# Patient Record
Sex: Female | Born: 1995 | Race: Black or African American | Hispanic: No | Marital: Single | Smoking: Current every day smoker
Health system: Southern US, Community
[De-identification: ages and names within clinical notes are randomized; demographics above are authoritative.]

## PROBLEM LIST (undated history)

## (undated) DIAGNOSIS — E282 Polycystic ovarian syndrome: Secondary | ICD-10-CM

## (undated) HISTORY — PX: KNEE SURGERY: SHX244

---

## 2017-07-19 ENCOUNTER — Encounter (HOSPITAL_COMMUNITY): Payer: Self-pay | Admitting: Emergency Medicine

## 2017-07-19 ENCOUNTER — Emergency Department (HOSPITAL_COMMUNITY)
Admission: EM | Admit: 2017-07-19 | Discharge: 2017-07-19 | Disposition: A | Discharge status: Home / Self Care | Payer: Self-pay | Attending: Emergency Medicine | Admitting: Emergency Medicine

## 2017-07-19 DIAGNOSIS — R110 Nausea: Secondary | ICD-10-CM | POA: Insufficient documentation

## 2017-07-19 DIAGNOSIS — Y929 Unspecified place or not applicable: Secondary | ICD-10-CM | POA: Insufficient documentation

## 2017-07-19 DIAGNOSIS — Y999 Unspecified external cause status: Secondary | ICD-10-CM | POA: Insufficient documentation

## 2017-07-19 DIAGNOSIS — Y9383 Activity, rough housing and horseplay: Secondary | ICD-10-CM | POA: Insufficient documentation

## 2017-07-19 DIAGNOSIS — F17210 Nicotine dependence, cigarettes, uncomplicated: Secondary | ICD-10-CM | POA: Insufficient documentation

## 2017-07-19 DIAGNOSIS — W228XXA Striking against or struck by other objects, initial encounter: Secondary | ICD-10-CM | POA: Insufficient documentation

## 2017-07-19 DIAGNOSIS — S0990XA Unspecified injury of head, initial encounter: Secondary | ICD-10-CM | POA: Insufficient documentation

## 2017-07-19 HISTORY — DX: Polycystic ovarian syndrome: E28.2

## 2017-07-19 NOTE — ED Provider Notes (Signed)
[facility]-EMERGENCY DEPT Provider Note   CSN: [id number] Arrival date & time: 07/19/17  [id number]     History   Chief Complaint Chief Complaint  Patient presents with  . Head Injury    HPI Jasmine Warren is a 22 y.o. female.  HPI   22 year old female presenting after head injury.  Happened last night.  States that Jasmine Warren was horsing around on the floor when Jasmine Warren arched backwards and struck the back of her head.  No loss of consciousness.  Since then Jasmine Warren has had persistent headache, some mild nausea and some difficulty with concentration.  No numbness, tingling or focal loss of strength.  No blood thinners.  Jasmine Warren has been ambulatory without difficulty since that time.  Past Medical History:  Diagnosis Date  . PCOS (polycystic ovarian syndrome)     There are no active problems to display for this patient.   Past Surgical History:  Procedure Laterality Date  . KNEE SURGERY Right     OB History    No data available       Home Medications    Prior to Admission medications   Not on File    Family History No family history on file.  Social History Social History   Tobacco Use  . Smoking status: Current Every Day Smoker    Types: Cigarettes  . Smokeless tobacco: Never Used  Substance Use Topics  . Alcohol use: Not on file  . Drug use: Not on file     Allergies   Patient has no known allergies.   Review of Systems Review of Systems  All systems reviewed and negative, other than as noted in HPI.  Physical Exam Updated Vital Signs BP 109/80 (BP Location: Right Arm)   Pulse 70   Temp 98.1 F (36.7 C) (Oral)   Resp 16   Ht 5\' 10"  (1.778 m)   Wt 68 kg (150 lb)   LMP 04/28/2017   SpO2 97%   BMI 21.52 kg/m   Physical Exam  Constitutional: Jasmine Warren is oriented to person, place, and time. Jasmine Warren appears well-developed and well-nourished. No distress.  HENT:  Head: Normocephalic and atraumatic.  Eyes: Conjunctivae and EOM are  normal. Pupils are equal, round, and reactive to light. Right eye exhibits no discharge. Left eye exhibits no discharge.  Neck: Neck supple.  Cardiovascular: Normal rate, regular rhythm and normal heart sounds. Exam reveals no gallop and no friction rub.  No murmur heard. Pulmonary/Chest: Effort normal and breath sounds normal. No respiratory distress.  Abdominal: Soft. Jasmine Warren exhibits no distension. There is no tenderness.  Musculoskeletal: Jasmine Warren exhibits no edema or tenderness.  No midline spinal tenderness  Neurological: Jasmine Warren is alert and oriented to person, place, and time. No cranial nerve deficit. Jasmine Warren exhibits normal muscle tone. Coordination normal.  Good finger to nose testing bilaterally.  Normal-appearing gait.  Skin: Skin is warm and dry.  Psychiatric: Jasmine Warren has a normal mood and affect. Her behavior is normal. Thought content normal.  Nursing note and vitals reviewed.    ED Treatments / Results  Labs (all labs ordered are listed, but only abnormal results are displayed) Labs Reviewed - No data to display  EKG  EKG Interpretation None       Radiology No results found.  Procedures Procedures (including critical care time)  Medications Ordered in ED Medications - No data to display   Initial Impression / Assessment and Plan / ED Course  I have reviewed the triage vital  signs and the nursing notes.  Pertinent labs & imaging results that were available during my care of the patient were reviewed by me and considered in my medical decision making (see chart for details).     22 year old female with symptoms of mild head injury.  Neuro exam is nonfocal.  No "red flags."  Neuroimaging deferred.  Return precautions were discussed.  As needed NSAIDs and cognitive rest otherwise.  Final Clinical Impressions(s) / ED Diagnoses   Final diagnoses:  Closed head injury, initial encounter    ED Discharge Orders    None       Raeford Razor, MD 07/19/17 [phone]

## 2017-07-19 NOTE — ED Triage Notes (Signed)
Pt reports that she was playing around and hit posterior head on the carpet flooring. Pt reports that she feels pressure in front and lateral sides of head. Denies and LOC or taking any blood thinners. Didn't have any n/v or blurred vision.

## 2017-07-23 ENCOUNTER — Encounter (HOSPITAL_COMMUNITY): Payer: Self-pay | Admitting: Emergency Medicine

## 2017-07-23 ENCOUNTER — Other Ambulatory Visit: Payer: Self-pay

## 2017-07-23 DIAGNOSIS — G4489 Other headache syndrome: Secondary | ICD-10-CM | POA: Insufficient documentation

## 2017-07-23 DIAGNOSIS — R51 Headache: Secondary | ICD-10-CM | POA: Diagnosis present

## 2017-07-23 DIAGNOSIS — F17210 Nicotine dependence, cigarettes, uncomplicated: Secondary | ICD-10-CM | POA: Insufficient documentation

## 2017-07-23 DIAGNOSIS — J029 Acute pharyngitis, unspecified: Secondary | ICD-10-CM | POA: Insufficient documentation

## 2017-07-23 NOTE — ED Triage Notes (Signed)
Pt states she was seen here on the 15th for a head injury and was told she had a concussion  Pt states she continues to have problems  Pt states she feels like something is squeezing the front of her head like a pressure, her temples are swollen, blurred vision, difficulty sleeping due to pain, and numbness and tingling in her right arm   Pt state she also has hx of strep throat and was to get her tonsils out but was unable to but now her throat is hurting

## 2017-07-24 ENCOUNTER — Emergency Department (HOSPITAL_COMMUNITY): Payer: Medicaid - Out of State

## 2017-07-24 ENCOUNTER — Emergency Department (HOSPITAL_COMMUNITY)
Admission: EM | Admit: 2017-07-24 | Discharge: 2017-07-24 | Disposition: A | Discharge status: Home / Self Care | Payer: Medicaid - Out of State | Attending: Emergency Medicine | Admitting: Emergency Medicine

## 2017-07-24 DIAGNOSIS — G4489 Other headache syndrome: Secondary | ICD-10-CM

## 2017-07-24 DIAGNOSIS — J029 Acute pharyngitis, unspecified: Secondary | ICD-10-CM

## 2017-07-24 LAB — I-STAT BETA HCG BLOOD, ED (MC, WL, AP ONLY): I-stat hCG, quantitative: <5 m[IU]/mL (ref <5)

## 2017-07-24 LAB — RAPID STREP SCREEN (MED CTR MEBANE ONLY): STREPTOCOCCUS, GROUP A SCREEN (DIRECT): NEGATIVE

## 2017-07-24 MED ORDER — IBUPROFEN 600 MG PO TABS: 600.0000 mg | ORAL_TABLET | Freq: Four times a day (QID) | ORAL | 0 refills | Status: AC | PRN

## 2017-07-24 NOTE — Discharge Instructions (Signed)
Take ibuprofen as directed for symptomatic relief of pain. Follow up with your doctor if symptoms persist.

## 2017-07-26 LAB — CULTURE, GROUP A STREP (THRC)

## 2017-07-27 NOTE — ED Provider Notes (Signed)
[facility]-EMERGENCY DEPT Provider Note   CSN: [id number] Arrival date & time: 07/23/17  2116     History   Chief Complaint Chief Complaint  Patient presents with  . Headache    HPI Jasmine Warren is a 22 y.o. female.  Patient returns to the emergency department after being evaluated for head injury 07/19/17 and diagnosed as a concussion. She reports her headache is worse. No vomiting, fatigue or somnolence, confusion. She does states that she has a sore throat and that she has a history of strep throat. No significant congestion, cough. Her headache pain is pressure-like, in the bilateral frontal areas she reports as "squeezing".    The history is provided by the patient. No language interpreter was used.  Headache   Pertinent negatives include no fever, no shortness of breath and no vomiting.    Past Medical History:  Diagnosis Date  . PCOS (polycystic ovarian syndrome)     There are no active problems to display for this patient.   Past Surgical History:  Procedure Laterality Date  . KNEE SURGERY Right      OB History   None      Home Medications    Prior to Admission medications   Medication Sig Start Date End Date Taking? Authorizing Provider  ibuprofen (ADVIL,MOTRIN) 600 MG tablet Take 1 tablet (600 mg total) by mouth every 6 (six) hours as needed. 07/24/17   Elpidio AnisUpstill, Keilen Kahl, PA-C    Family History Family History  Problem Relation Age of Onset  . Hypertension Other   . Cancer Other   . Diabetes Other   . Asthma Other     Social History Social History   Tobacco Use  . Smoking status: Current Every Day Smoker    Types: Cigarettes  . Smokeless tobacco: Never Used  Substance Use Topics  . Alcohol use: Yes    Comment: occ  . Drug use: No     Allergies   Patient has no known allergies.   Review of Systems Review of Systems  Constitutional: Negative for chills and fever.  HENT: Positive for sore throat.  Negative for congestion and rhinorrhea.   Respiratory: Negative.  Negative for cough and shortness of breath.   Cardiovascular: Negative.   Gastrointestinal: Negative.  Negative for abdominal pain and vomiting.  Musculoskeletal: Negative.  Negative for neck pain.  Skin: Negative.   Neurological: Positive for headaches.     Physical Exam Updated Vital Signs BP 117/79 (BP Location: Left Arm)   Pulse 70   Temp 98.5 F (36.9 C) (Oral)   Resp 14   Ht 5\' 10"  (1.778 m)   Wt 83.7 kg (184 lb 8 oz)   SpO2 97%   BMI 26.47 kg/m   Physical Exam  Constitutional: She appears well-developed and well-nourished.  HENT:  Head: Normocephalic.  Neck: Normal range of motion. Neck supple.  Cardiovascular: Normal rate and regular rhythm.  Pulmonary/Chest: Effort normal and breath sounds normal.  Abdominal: Soft. Bowel sounds are normal. There is no tenderness. There is no rebound and no guarding.  Musculoskeletal: Normal range of motion.  Neurological: She is alert. She has normal strength. She displays normal reflexes. No cranial nerve deficit. Coordination normal. GCS eye subscore is 4. GCS verbal subscore is 5. GCS motor subscore is 6.  CN's 3-12 grossly intact. Speech is clear and focused. No facial asymmetry. No lateralizing weakness. Reflexes are equal. No deficits of coordination. Ambulatory without imbalance.    Skin: Skin is  warm and dry. No rash noted.  Psychiatric: She has a normal mood and affect.     ED Treatments / Results  Labs (all labs ordered are listed, but only abnormal results are displayed) Labs Reviewed  RAPID STREP SCREEN (NOT AT [facility])  CULTURE, GROUP A STREP ([facility])  RAPID STREP SCREEN (NOT AT [facility])  I-STAT BETA HCG BLOOD, ED ([facility], [facility], [facility] ONLY)    EKG None  Radiology No results found.  Procedures Procedures (including critical care time)  Medications Ordered in ED Medications - No data to display   Initial Impression / Assessment and Plan / ED Course  I  have reviewed the triage vital signs and the nursing notes.  Pertinent labs & imaging results that were available during my care of the patient were reviewed by me and considered in my medical decision making (see chart for details).     Patient evaluated by head CT and found to be negative. Strep test is also negative. VSS. Normal neurologic exam.   She can be discharged home with outpatient follow up as needed.   Final Clinical Impressions(s) / ED Diagnoses   Final diagnoses:  Other headache syndrome  Pharyngitis, unspecified etiology    ED Discharge Orders        Ordered    ibuprofen (ADVIL,MOTRIN) 600 MG tablet  Every 6 hours PRN     07/24/17 0523       Elpidio Anis, PA-C 07/27/17 0742    Palumbo, April, MD 07/29/17 1610

## 2017-09-04 ENCOUNTER — Other Ambulatory Visit: Payer: Self-pay

## 2017-09-04 ENCOUNTER — Emergency Department (HOSPITAL_COMMUNITY): Payer: Medicaid - Out of State

## 2017-09-04 ENCOUNTER — Emergency Department (HOSPITAL_COMMUNITY)
Admission: EM | Admit: 2017-09-04 | Discharge: 2017-09-05 | Disposition: A | Discharge status: Home / Self Care | Payer: Medicaid - Out of State | Attending: Emergency Medicine | Admitting: Emergency Medicine

## 2017-09-04 ENCOUNTER — Encounter (HOSPITAL_COMMUNITY): Payer: Self-pay | Admitting: Emergency Medicine

## 2017-09-04 DIAGNOSIS — F17210 Nicotine dependence, cigarettes, uncomplicated: Secondary | ICD-10-CM | POA: Insufficient documentation

## 2017-09-04 DIAGNOSIS — R0789 Other chest pain: Secondary | ICD-10-CM

## 2017-09-04 MED ORDER — GI COCKTAIL ~~LOC~~
30.0000 mL | Freq: Once | ORAL | Status: AC
  Administered 2017-09-04: 30 mL via ORAL
  Filled 2017-09-04: qty 30

## 2017-09-04 NOTE — ED Provider Notes (Signed)
[facility]-EMERGENCY DEPT Provider Note   CSN: [id number] Arrival date & time: 09/04/17  2020     History   Chief Complaint Chief Complaint  Patient presents with  . Chest Pain    HPI Jasmine Warren is a 22 y.o. female.  22 yo F with a chief complaint of chest pain.  This been going on for the past couple hours.  Described as a dull aching to the substernal area.  Worse with lying back flat.  Denies shortness of breath denies diaphoresis denies nausea or vomiting.  Nothing seems to make this better.  She denies hemoptysis denies lower extremity edema denies recent surgery or hospitalization denies estrogen use.  Denies history of PE or DVT.  She denies cough congestion or fever.  Denies trauma.  Denies history of MI.  She is a current smoker.  Denies cocaine denies family history of MI.  Denies hypertension hyperlipidemia or diabetes.  The history is provided by the patient.  Chest Pain   This is a new problem. The current episode started 1 to 2 hours ago. The problem occurs constantly. The problem has not changed since onset.The pain is present in the substernal region. The pain is at a severity of 6/10. The pain is moderate. The quality of the pain is described as brief and heavy. The pain does not radiate. Duration of episode(s) is 2 hours. Pertinent negatives include no dizziness, no fever, no headaches, no nausea, no palpitations, no shortness of breath and no vomiting. She has tried nothing for the symptoms. The treatment provided no relief.    Past Medical History:  Diagnosis Date  . PCOS (polycystic ovarian syndrome)     There are no active problems to display for this patient.   Past Surgical History:  Procedure Laterality Date  . KNEE SURGERY Right      OB History   None      Home Medications    Prior to Admission medications   Medication Sig Start Date End Date Taking? Authorizing Provider  ibuprofen (ADVIL,MOTRIN) 600 MG  tablet Take 1 tablet (600 mg total) by mouth every 6 (six) hours as needed. 07/24/17   Elpidio Anis, PA-C    Family History Family History  Problem Relation Age of Onset  . Hypertension Other   . Cancer Other   . Diabetes Other   . Asthma Other     Social History Social History   Tobacco Use  . Smoking status: Current Every Day Smoker    Types: Cigarettes  . Smokeless tobacco: Never Used  Substance Use Topics  . Alcohol use: Yes    Comment: occ  . Drug use: No     Allergies   Patient has no known allergies.   Review of Systems Review of Systems  Constitutional: Negative for chills and fever.  HENT: Negative for congestion and rhinorrhea.   Eyes: Negative for redness and visual disturbance.  Respiratory: Negative for shortness of breath and wheezing.   Cardiovascular: Positive for chest pain. Negative for palpitations.  Gastrointestinal: Negative for nausea and vomiting.  Genitourinary: Negative for dysuria and urgency.  Musculoskeletal: Negative for arthralgias and myalgias.  Skin: Negative for pallor and wound.  Neurological: Negative for dizziness and headaches.     Physical Exam Updated Vital Signs BP 122/84 (BP Location: Right Arm)   Pulse 86   Temp 98.1 F (36.7 C) (Oral)   Resp 18   Ht  (1.778 m)   Wt 88.9 kg (  196 lb)   SpO2 100%   BMI 28.12 kg/m   Physical Exam  Constitutional: She is oriented to person, place, and time. She appears well-developed and well-nourished. No distress.  HENT:  Head: Normocephalic and atraumatic.  Eyes: Pupils are equal, round, and reactive to light. EOM are normal.  Neck: Normal range of motion. Neck supple.  Cardiovascular: Normal rate and regular rhythm. Exam reveals no gallop and no friction rub.  No murmur heard. Pulmonary/Chest: Effort normal. She has no wheezes. She has no rales.  Abdominal: Soft. She exhibits no distension. There is tenderness (mild epigastric).  Musculoskeletal: She exhibits no edema  or tenderness.  Neurological: She is alert and oriented to person, place, and time.  Skin: Skin is warm and dry. She is not diaphoretic.  Psychiatric: She has a normal mood and affect. Her behavior is normal.  Nursing note and vitals reviewed.    ED Treatments / Results  Labs (all labs ordered are listed, but only abnormal results are displayed) Labs Reviewed  BASIC METABOLIC PANEL  CBC  I-STAT TROPONIN, ED  I-STAT BETA HCG BLOOD, ED ([facility], [facility], [facility] ONLY)    EKG EKG Interpretation  Date/Time:  Wednesday Sep 04 2017 20:28:50 EDT Ventricular Rate:  80 PR Interval:    QRS Duration: 87 QT Interval:  366 QTC Calculation: 423 R Axis:   75 Text Interpretation:  Sinus rhythm Probable left atrial enlargement No old tracing to compare Confirmed by Melene Plan [phone]) on 09/04/2017 9:07:19 PM   Radiology Dg Chest 2 View  Result Date: 09/04/2017 CLINICAL DATA:  22 year old female with a history of chest pain EXAM: CHEST - 2 VIEW COMPARISON:  None. FINDINGS: The heart size and mediastinal contours are within normal limits. Both lungs are clear. The visualized skeletal structures are unremarkable. IMPRESSION: No radiographic evidence of acute cardiopulmonary disease Electronically Signed   By: Gilmer Mor D.O.   On: 09/04/2017 20:54    Procedures Procedures (including critical care time) Discussed smoking cessation with patient and was they were offerred resources to help stop.  Total time was 5 min CPT code 60454.   Medications Ordered in ED Medications  gi cocktail (Maalox,Lidocaine,Donnatal) (30 mLs Oral Given 09/04/17 2218)     Initial Impression / Assessment and Plan / ED Course  I have reviewed the triage vital signs and the nursing notes.  Pertinent labs & imaging results that were available during my care of the patient were reviewed by me and considered in my medical decision making (see chart for details).     21 yo F with a chief complaint of chest pain.  This is completely  atypical of ACS.  Most likely this is reflux.  Treat with a GI cocktail.  EKG and chest x-ray are unremarkable. PERC negative.  D/c home.   10:41 PM:  I have discussed the diagnosis/risks/treatment options with the patient and believe the pt to be eligible for discharge home to follow-up with PCP. We also discussed returning to the ED immediately if new or worsening sx occur. We discussed the sx which are most concerning (e.g., sudden worsening pain, fever, inability to tolerate by mouth) that necessitate immediate return. Medications administered to the patient during their visit and any new prescriptions provided to the patient are listed below.  Medications given during this visit Medications  gi cocktail (Maalox,Lidocaine,Donnatal) (30 mLs Oral Given 09/04/17 2218)    Images reviewed cxr without focal infiltrate   The patient appears reasonably screen and/or stabilized for discharge  and I doubt any other medical condition or other [facility] requiring further screening, evaluation, or treatment in the ED at this time prior to discharge.    Final Clinical Impressions(s) / ED Diagnoses   Final diagnoses:  Atypical chest pain    ED Discharge Orders    None       Melene Plan, DO 09/04/17 2241

## 2017-09-04 NOTE — ED Triage Notes (Signed)
Pt reports having chest pain that started appx 1 hr ago. Pt reports that last time she had similar symptoms her blood count was low.

## 2017-09-04 NOTE — Discharge Instructions (Signed)
Try zantac or pepcid  twice a day.

## 2017-09-04 NOTE — ED Notes (Signed)
Verbal order to hold blood work for now and give GI Cocktail

## 2017-09-08 ENCOUNTER — Emergency Department (HOSPITAL_COMMUNITY)
Admission: EM | Admit: 2017-09-08 | Discharge: 2017-09-08 | Disposition: A | Discharge status: Home / Self Care | Payer: Medicaid - Out of State | Attending: Emergency Medicine | Admitting: Emergency Medicine

## 2017-09-08 ENCOUNTER — Encounter (HOSPITAL_COMMUNITY): Payer: Self-pay | Admitting: Nurse Practitioner

## 2017-09-08 ENCOUNTER — Emergency Department (HOSPITAL_COMMUNITY): Payer: Medicaid - Out of State

## 2017-09-08 DIAGNOSIS — F17210 Nicotine dependence, cigarettes, uncomplicated: Secondary | ICD-10-CM | POA: Insufficient documentation

## 2017-09-08 DIAGNOSIS — R0789 Other chest pain: Secondary | ICD-10-CM

## 2017-09-08 DIAGNOSIS — K219 Gastro-esophageal reflux disease without esophagitis: Secondary | ICD-10-CM | POA: Insufficient documentation

## 2017-09-08 LAB — I-STAT TROPONIN, ED
TROPONIN I, POC: 0 ng/mL (ref 0.00–0.08)
TROPONIN I, POC: 0 ng/mL (ref 0.00–0.08)

## 2017-09-08 LAB — BASIC METABOLIC PANEL
Anion gap: 12 (ref 5–15)
BUN: 11 mg/dL (ref 6–20)
CALCIUM: 9.6 mg/dL (ref 8.9–10.3)
CO2: 24 mmol/L (ref 22–32)
CREATININE: 1.02 mg/dL — AB (ref 0.44–1.00)
Chloride: 103 mmol/L (ref 101–111)
GFR calc non Af Amer: >60 mL/min (ref >60)
Glucose, Bld: 77 mg/dL (ref 65–99)
Potassium: 4.2 mmol/L (ref 3.5–5.1)
SODIUM: 139 mmol/L (ref 135–145)

## 2017-09-08 LAB — I-STAT BETA HCG BLOOD, ED (MC, WL, AP ONLY): I-stat hCG, quantitative: <5 m[IU]/mL (ref <5)

## 2017-09-08 LAB — CBC
HCT: 42.8 % (ref 36.0–46.0)
Hemoglobin: 14.3 g/dL (ref 12.0–15.0)
MCH: 28.3 pg (ref 26.0–34.0)
MCHC: 33.4 g/dL (ref 30.0–36.0)
MCV: 84.6 fL (ref 78.0–100.0)
Platelets: 221 10*3/uL (ref 150–400)
RBC: 5.06 MIL/uL (ref 3.87–5.11)
RDW: 13.4 % (ref 11.5–15.5)
WBC: 8.8 10*3/uL (ref 4.0–10.5)

## 2017-09-08 LAB — D-DIMER, QUANTITATIVE (NOT AT ARMC)

## 2017-09-08 MED ORDER — GI COCKTAIL ~~LOC~~
30.0000 mL | Freq: Once | ORAL | Status: AC
  Administered 2017-09-08: 30 mL via ORAL
  Filled 2017-09-08: qty 30

## 2017-09-08 MED ORDER — OMEPRAZOLE 20 MG PO CPDR: 20.0000 mg | DELAYED_RELEASE_CAPSULE | Freq: Every day | ORAL | 0 refills | Status: AC

## 2017-09-08 MED ORDER — FAMOTIDINE 20 MG PO TABS: 20.0000 mg | ORAL_TABLET | Freq: Two times a day (BID) | ORAL | 0 refills | Status: AC

## 2017-09-08 MED ORDER — SODIUM CHLORIDE 0.9 % IV BOLUS: 1000.0000 mL | Freq: Once | INTRAVENOUS | Status: DC

## 2017-09-08 MED ORDER — FAMOTIDINE 20 MG PO TABS
20.0000 mg | ORAL_TABLET | Freq: Once | ORAL | Status: AC
  Administered 2017-09-08: 20 mg via ORAL
  Filled 2017-09-08: qty 1

## 2017-09-08 MED ORDER — MAGNESIUM SULFATE 2 GM/50ML IV SOLN: 2.0000 g | Freq: Once | INTRAVENOUS | Status: DC

## 2017-09-08 NOTE — ED Triage Notes (Signed)
Pt is c/o generalized chest pain associated with mild shortness of breath and cough. States the symptoms have worsened since last time she was seen 09/04/17.

## 2017-09-08 NOTE — ED Provider Notes (Signed)
[facility]-EMERGENCY DEPT Provider Note   CSN: [id number] Arrival date & time: 09/08/17  1557     History   Chief Complaint Chief Complaint  Patient presents with  . Chest Pain    HPI Jasmine Warren is a 22 y.o. female with history of PCOS, here for evaluation of chest pain onset 5/1.  Was seen in ER for same that day.  Chest pain is described as tight and like something pulling on her chest.  Onset while she was getting ready to take a nap.  Chest pain is constant.  Nonexertional.  Pain is localized to epigastric area that feels "puffy" and radiates to the left anterior chest towards nipple line.  Aggravating factors include palpation and breathing, states it hurts to breathe.  Feels like the pain is worse when she is laying flat and about to go to sleep.  Occasionally feels her heart racing.  Has felt night sweats and chills every other night since.  Has a mild cough, nonproductive.  In the last 4 days she is noticed increasing heartburn every time she eats however says that the chest discomfort is different from heartburn.  Flew from [place] 3 months ago.  No birth control pills.  No recent surgeries, immobilization, malignancy, leg swelling, calf pain, history of DVT/PE.  Occasional smoker.  She has no fever, nausea, vomiting, changes in bowel movements. HPI  Past Medical History:  Diagnosis Date  . PCOS (polycystic ovarian syndrome)     There are no active problems to display for this patient.   Past Surgical History:  Procedure Laterality Date  . KNEE SURGERY Right      OB History   None      Home Medications    Prior to Admission medications   Medication Sig Start Date End Date Taking? Authorizing Provider  ibuprofen (ADVIL,MOTRIN) 600 MG tablet Take 1 tablet (600 mg total) by mouth every 6 (six) hours as needed. 07/24/17  Yes Upstill, Melvenia Beam, PA-C  ranitidine (ZANTAC) 75 MG tablet Take 75 mg by mouth as needed for heartburn.   Yes  [provider]  famotidine (PEPCID) 20 MG tablet Take 1 tablet (20 mg total) by mouth 2 (two) times daily. 09/08/17   Liberty Handy, PA-C  omeprazole (PRILOSEC) 20 MG capsule Take 1 capsule (20 mg total) by mouth daily. 09/08/17   Liberty Handy, PA-C    Family History Family History  Problem Relation Age of Onset  . Hypertension Other   . Cancer Other   . Diabetes Other   . Asthma Other     Social History Social History   Tobacco Use  . Smoking status: Current Every Day Smoker    Types: Cigarettes  . Smokeless tobacco: Never Used  Substance Use Topics  . Alcohol use: Yes    Comment: occ  . Drug use: No     Allergies   Patient has no known allergies.   Review of Systems Review of Systems  Respiratory: Positive for cough, chest tightness and shortness of breath.   Cardiovascular: Positive for chest pain.  Gastrointestinal:       Heart burn  All other systems reviewed and are negative.    Physical Exam Updated Vital Signs BP 122/79   Pulse 60   Temp 98.6 F (37 C) (Oral)   Resp 15   Wt 88 kg (194 lb)   SpO2 100%   BMI 27.84 kg/m   Physical Exam  Constitutional: She appears well-developed  and well-nourished.  NAD. Non toxic.   HENT:  Head: Normocephalic and atraumatic.  Nose: Nose normal.  Moist mucous membranes. Tonsils and oropharynx normal  Eyes: Conjunctivae, EOM and lids are normal.  Neck: Trachea normal and normal range of motion.  Trachea midline. No cervical adenopathy  Cardiovascular: Normal rate, regular rhythm, S1 normal, S2 normal and normal heart sounds.  Pulses:      Carotid pulses are 2+ on the right side, and 2+ on the left side.      Radial pulses are 2+ on the right side, and 2+ on the left side.       Dorsalis pedis pulses are 2+ on the right side, and 2+ on the left side.  RRR. No orthopnea. No LE edema or calf tenderness.   Pulmonary/Chest: Effort normal and breath sounds normal. No respiratory distress. She has no  decreased breath sounds. She has no rhonchi. She exhibits tenderness.  Reproducible epigastric, lower sternal and left sided chest wall tenderness. CP not reproducible with AROM of upper extremities. No rales or wheezing.    Abdominal: Soft. Bowel sounds are normal. There is tenderness in the epigastric area.  No epigastric tenderness. No distention.   Neurological: She is alert. GCS eye subscore is 4. GCS verbal subscore is 5. GCS motor subscore is 6.  Skin: Skin is warm and dry. Capillary refill takes less than 2 seconds.  No rash to chest wall  Psychiatric: She has a normal mood and affect. Her speech is normal and behavior is normal. Judgment and thought content normal. Cognition and memory are normal.     ED Treatments / Results  Labs (all labs ordered are listed, but only abnormal results are displayed) Labs Reviewed  BASIC METABOLIC PANEL - Abnormal; Notable for the following components:      Result Value   Creatinine, Ser 1.02 (*)    All other components within normal limits  CBC  D-DIMER, QUANTITATIVE (NOT AT [facility])  I-STAT TROPONIN, ED  I-STAT BETA HCG BLOOD, ED ([facility], [facility], [facility] ONLY)  I-STAT TROPONIN, ED    EKG EKG Interpretation  Date/Time:  Sunday Sep 08 2017 16:09:25 EDT Ventricular Rate:  75 PR Interval:  136 QRS Duration: 80 QT Interval:  354 QTC Calculation: 395 R Axis:   84 Text Interpretation:  Normal sinus rhythm Normal ECG Confirmed by Loren Racer ([id number]) on 09/08/2017 9:11:06 PM   Radiology Dg Chest 2 View  Result Date: 09/08/2017 CLINICAL DATA:  Initial evaluation for acute left-sided chest pain for 1 week. EXAM: CHEST - 2 VIEW COMPARISON:  Prior radiograph from 09/04/2017. FINDINGS: The cardiac and mediastinal silhouettes are stable in size and contour, and remain within normal limits. The lungs are normally inflated. No airspace consolidation, pleural effusion, or pulmonary edema is identified. There is no pneumothorax. No acute osseous abnormality  identified. IMPRESSION: No active cardiopulmonary disease. Electronically Signed   By: Rise Mu M.D.   On: 09/08/2017 17:36    Procedures Procedures (including critical care time)  Medications Ordered in ED Medications  gi cocktail (Maalox,Lidocaine,Donnatal) (30 mLs Oral Given 09/08/17 2030)  famotidine (PEPCID) tablet 20 mg (20 mg Oral Given 09/08/17 2030)     Initial Impression / Assessment and Plan / ED Course  I have reviewed the triage vital signs and the nursing notes.  Pertinent labs & imaging results that were available during my care of the patient were reviewed by me and considered in my medical decision making (see chart for details).  22 year old female here for atypical chest pain.  Constant, nonexertional, reproducible on exam.  Also has epigastric tenderness.  Has had increased acid reflux with every meal.  Does report some pleuritic component to it.  Last flew from [place] 3 months ago.  Was seen in the ER 4 days ago for same, work-up at that time included EKG, chest x-ray which was reassuring. HEART score < 3. She does not have strong family history of CAD.  Lab work, chest x-ray, EKG reviewed and unremarkable.  D-dimer negative.  We will give GI cocktail and Pepcid, high suspicion for GI etiology.  Pending delta troponin.  Final Clinical Impressions(s) / ED Diagnoses   Trop 0.00 > 0.00.  Given reassuring labs, low heart score, pt deemed appropriate for dc with close cardiology f/u. Will dc with omeprazole. Discussed return precautions.  Final diagnoses:  Atypical chest pain  Gastroesophageal reflux disease, esophagitis presence not specified    ED Discharge Orders        Ordered    omeprazole (PRILOSEC) 20 MG capsule  Daily     09/08/17 2202    famotidine (PEPCID) 20 MG tablet  2 times daily     09/08/17 2202       Jerrell Mylar 09/08/17 2308    Loren Racer, MD 09/12/17 [phone]

## 2017-09-08 NOTE — Discharge Instructions (Addendum)
Work up in the ER was reassuring today. The cause of your symptoms is unclear and you need more evaluation by cardiology and gastroenterology. Some of the symptoms you described may be from acid reflux. I have prescribed you medication to help with acid reflux.  Take this daily for the next 15 days.  Follow up with your doctor in 1-2 weeks if symptoms persist. You are safe to fly to [place].

## 2017-09-08 NOTE — ED Notes (Signed)
Pt is hard stick, this nurse tried with no success. Asked Herminio Heads to [place] IV

## 2018-12-18 IMAGING — CR DG CHEST 2V
2 series · 2 of 2 positions shown · non-contrast
Comparison: Prior radiograph from 09/04/2017.

CLINICAL DATA: Initial evaluation for acute left-sided chest pain
for 1 week.

EXAM:
CHEST - 2 VIEW

[w chest pa]
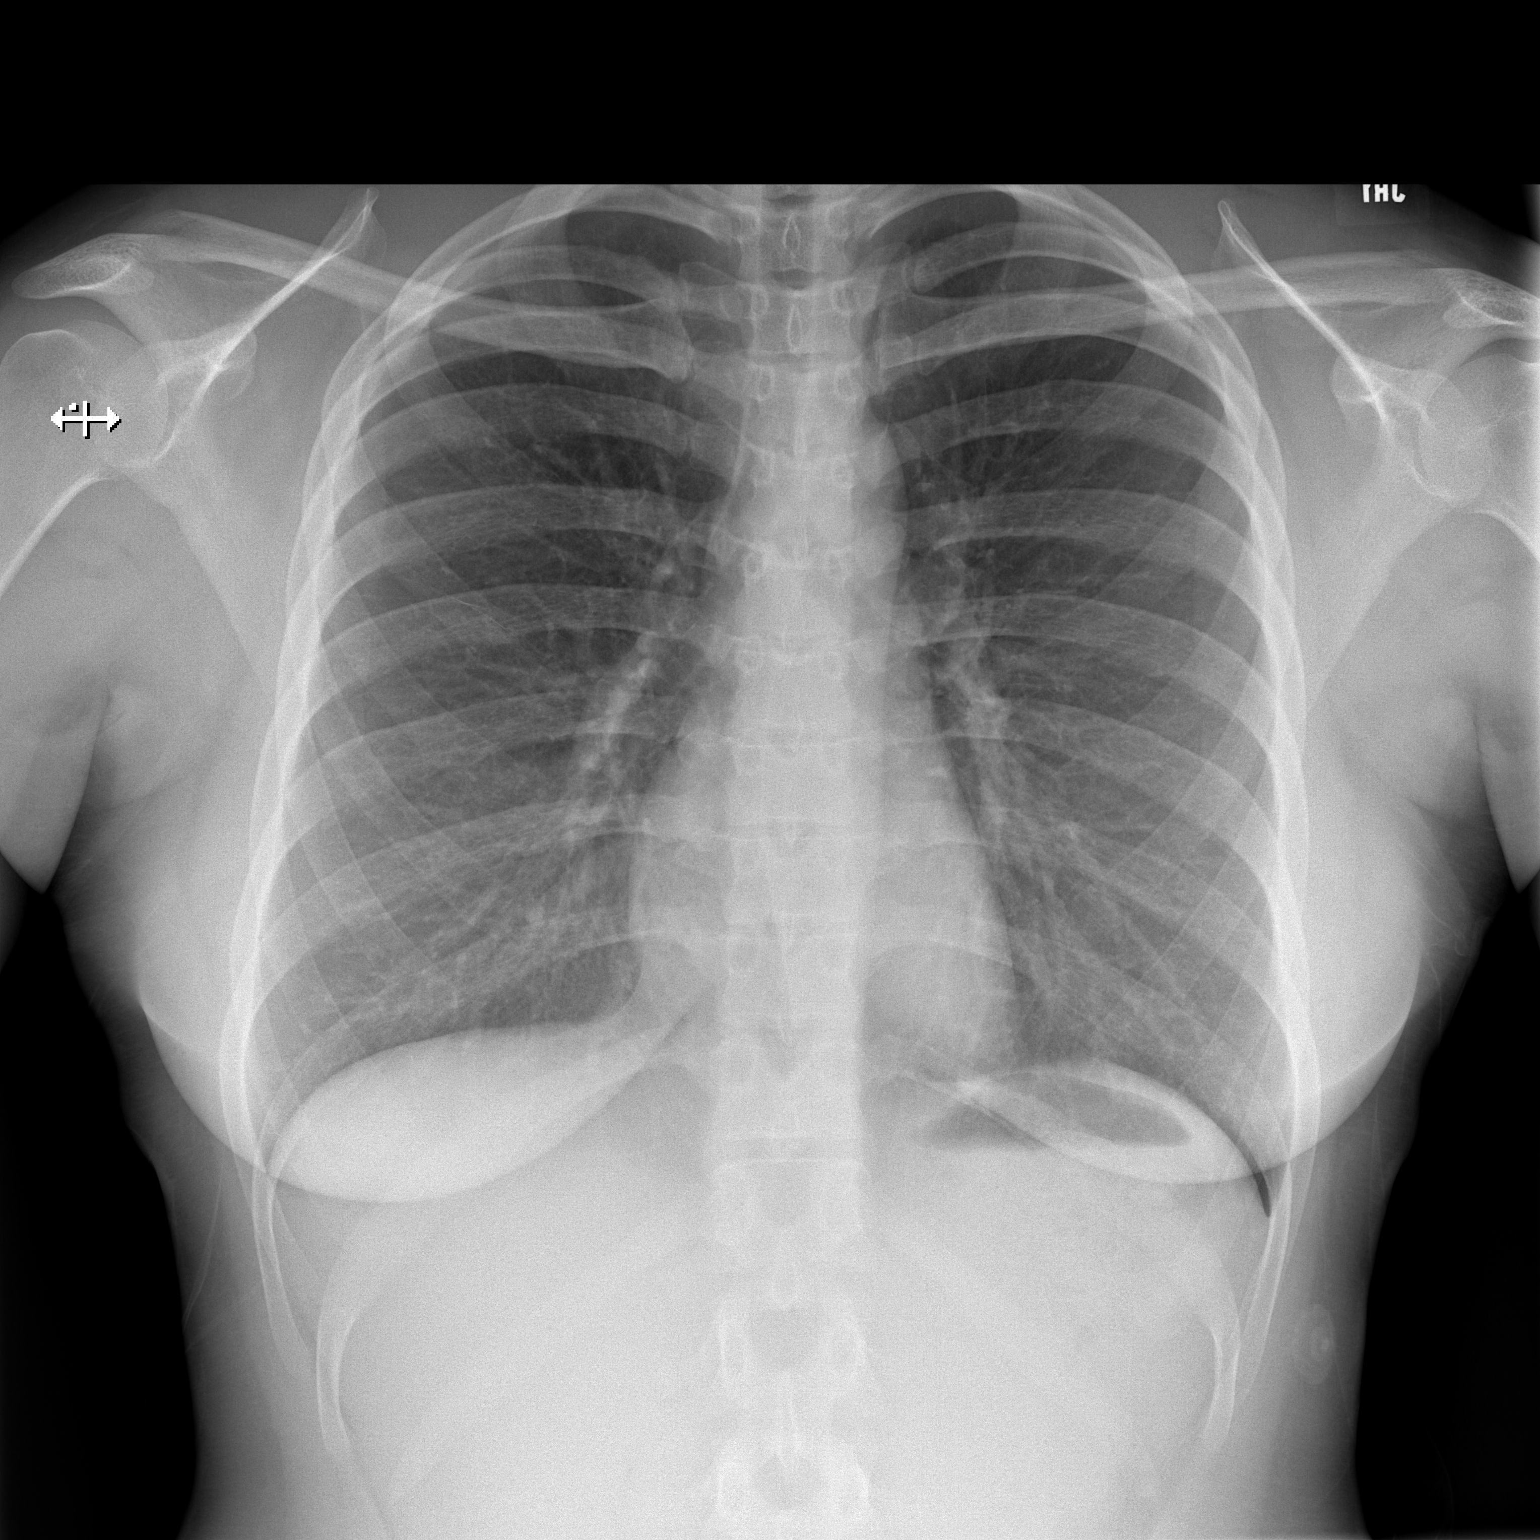

[w chest lat]
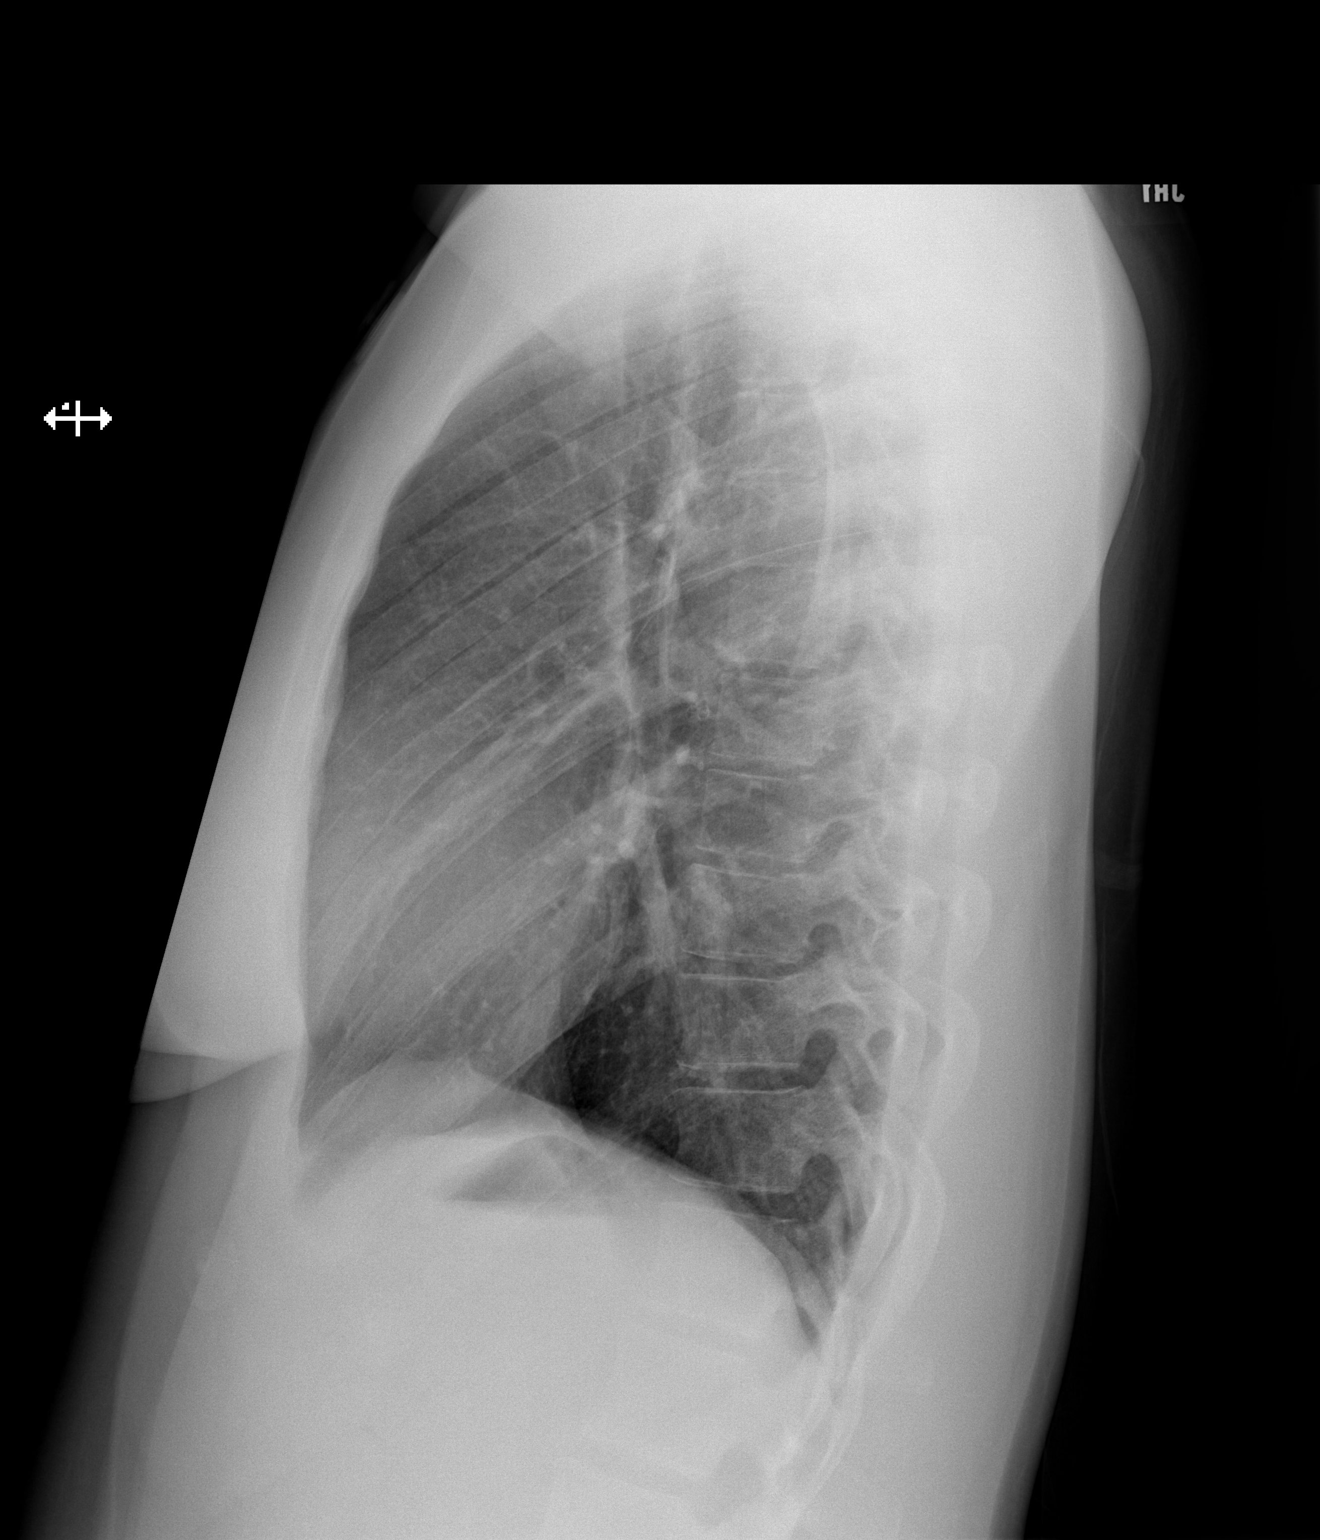

[2 of 2 positions shown; findings below may reference images not displayed]

FINDINGS: The cardiac and mediastinal silhouettes are stable in size and
contour, and remain within normal limits.

The lungs are normally inflated. No airspace consolidation, pleural
effusion, or pulmonary edema is identified. There is no
pneumothorax.

No acute osseous abnormality identified.
IMPRESSION: No active cardiopulmonary disease.
